# Patient Record
Sex: Female | Born: 1960
Health system: Southern US, Community
[De-identification: ages and names within clinical notes are randomized; demographics above are authoritative.]

## PROBLEM LIST (undated history)

## (undated) DIAGNOSIS — Z9289 Personal history of other medical treatment: Secondary | ICD-10-CM

## (undated) DIAGNOSIS — T7840XA Allergy, unspecified, initial encounter: Secondary | ICD-10-CM

## (undated) DIAGNOSIS — F419 Anxiety disorder, unspecified: Secondary | ICD-10-CM

## (undated) DIAGNOSIS — D649 Anemia, unspecified: Secondary | ICD-10-CM

## (undated) HISTORY — DX: Anxiety disorder, unspecified: F41.9

## (undated) HISTORY — DX: Allergy, unspecified, initial encounter: T78.40XA

## (undated) HISTORY — DX: Anemia, unspecified: D64.9

## (undated) HISTORY — DX: Personal history of other medical treatment: Z92.89

## (undated) HISTORY — PX: TUBAL LIGATION: SHX77

---

## 2005-12-18 HISTORY — PX: MOUTH SURGERY: SHX715

## 2015-03-09 DIAGNOSIS — Z9289 Personal history of other medical treatment: Secondary | ICD-10-CM

## 2015-03-09 HISTORY — DX: Personal history of other medical treatment: Z92.89

## 2015-03-09 LAB — HM PAP SMEAR: HM Pap smear: NEGATIVE

## 2015-03-16 LAB — HM MAMMOGRAPHY

## 2016-10-18 HISTORY — PX: ROTATOR CUFF REPAIR: SHX139

## 2017-11-22 ENCOUNTER — Ambulatory Visit (INDEPENDENT_AMBULATORY_CARE_PROVIDER_SITE_OTHER): Payer: BLUE CROSS/BLUE SHIELD | Admitting: Maternal Newborn

## 2017-11-22 ENCOUNTER — Encounter: Payer: Self-pay | Admitting: Maternal Newborn

## 2017-11-22 VITALS — BP 120/80 | HR 84 | Ht 63.0 in | Wt 144.0 lb

## 2017-11-22 DIAGNOSIS — Z131 Encounter for screening for diabetes mellitus: Secondary | ICD-10-CM

## 2017-11-22 DIAGNOSIS — Z1329 Encounter for screening for other suspected endocrine disorder: Secondary | ICD-10-CM

## 2017-11-22 DIAGNOSIS — Z13220 Encounter for screening for lipoid disorders: Secondary | ICD-10-CM | POA: Diagnosis not present

## 2017-11-22 DIAGNOSIS — Z01419 Encounter for gynecological examination (general) (routine) without abnormal findings: Secondary | ICD-10-CM

## 2017-11-22 DIAGNOSIS — Z1321 Encounter for screening for nutritional disorder: Secondary | ICD-10-CM | POA: Diagnosis not present

## 2017-11-22 DIAGNOSIS — G4709 Other insomnia: Secondary | ICD-10-CM

## 2017-11-22 DIAGNOSIS — Z124 Encounter for screening for malignant neoplasm of cervix: Secondary | ICD-10-CM | POA: Diagnosis not present

## 2017-11-22 MED ORDER — ALPRAZOLAM 0.25 MG PO TABS: 0.2500 mg | ORAL_TABLET | Freq: Every evening | ORAL | 2 refills | Status: DC | PRN

## 2017-11-22 NOTE — Progress Notes (Signed)
Gynecology Annual Exam  PCP: Patient, No Pcp Per  Chief Complaint:  Chief Complaint  Patient presents with  . Gynecologic Exam    clogged hair, not tender; ref for mammo; ref for gastro; old rx to discuss    History of Present Illness:Patient is a 56 y.o. [id number] presents for annual exam. The patient has no complaints today.   LMP: No LMP recorded. Patient has had an ablation. Postcoital Bleeding: no Dysmenorrhea: not applicable   The patient is sexually active. She denies dyspareunia.  The patient does perform self breast exams.  There is no notable family history of breast or ovarian cancer in her family.  The patient wears seatbelts: yes.   The patient has regular exercise: yes.    The patient denies current symptoms of depression.     Review of Systems  Constitutional: Negative for fever, malaise/fatigue and weight loss.  HENT: Negative for congestion, sinus pain and sore throat.   Eyes: Negative.   Respiratory: Negative for cough, shortness of breath and wheezing.   Cardiovascular: Negative for chest pain and palpitations.  Gastrointestinal: Negative for abdominal pain, constipation, diarrhea, heartburn and nausea.  Genitourinary: Negative for dysuria, frequency and urgency.  Musculoskeletal:       Shoulder pain from rotator cuff injury  Skin: Negative.   Endo/Heme/Allergies: Negative.   Psychiatric/Behavioral: Negative for depression. The patient is nervous/anxious.   All other systems reviewed and are negative.   Past Medical History:  Past Medical History:  Diagnosis Date  . History of mammogram 05/2013; 03/16/15   neg; neg  . History of Papanicolaou smear of cervix 03/09/2015   neg    Past Surgical History:  Past Surgical History:  Procedure Laterality Date  . MOUTH SURGERY  2007  . ROTATOR CUFF REPAIR Right 10/2016  . TUBAL LIGATION      Gynecologic History:  No LMP recorded. Patient has had an ablation. Last Pap: 03/09/2015. Results were:  NIL. Last mammogram: 03/16/2015 Results were: BI-RAD I Obstetric History: [id number]: G2P2002  Family History:  Family History  Problem Relation Age of Onset  . Hypertension Mother   . Cancer Father        lung    Social History:  Social History   Socioeconomic History  . Marital status: Married    Spouse name: Not on file  . Number of children: Not on file  . Years of education: Not on file  . Highest education level: Not on file  Social Needs  . Financial resource strain: Not on file  . Food insecurity - worry: Not on file  . Food insecurity - inability: Not on file  . Transportation needs - medical: Not on file  . Transportation needs - non-medical: Not on file  Occupational History  . Not on file  Tobacco Use  . Smoking status: Never Smoker  . Smokeless tobacco: Never Used  Substance and Sexual Activity  . Alcohol use: Yes  . Drug use: No  . Sexual activity: Yes    Birth control/protection: Surgical  Other Topics Concern  . Not on file  Social History Narrative  . Not on file    Allergies:  No Known Allergies  Medications: Prior to Admission medications   Not on File    Physical Exam Blood pressure 120/80, pulse 84, height 5\' 3"  (1.6 m), weight 144 lb (65.3 kg).  General: NAD HEENT: normocephalic, anicteric Thyroid: no enlargement, no palpable nodules Pulmonary: No increased work of breathing, CTAB Cardiovascular: RRR, distal pulses  2+ Breast: Breasts symmetrical, no tenderness, no palpable nodules or masses, no skin or nipple retraction present, no nipple discharge.  No axillary or supraclavicular lymphadenopathy. Abdomen: NABS, soft, non-tender, non-distended.  Umbilicus without lesions.  No hepatomegaly, splenomegaly or masses palpable. No evidence of hernia  Genitourinary:  External: Small raised non-tender bump that appears to be a blocked hair  follicle. Otherwise, normal external female genitalia.  Normal urethral meatus,  normal Bartholin's and Skene's  glands.    Vagina: Normal vaginal mucosa, no evidence of prolapse.    Cervix: Grossly normal in appearance, no bleeding  Uterus: Non-enlarged, mobile, normal contour.  No CMT  Adnexa: ovaries non-enlarged, no adnexal masses  Rectal: deferred  Lymphatic: no evidence of inguinal lymphadenopathy Extremities: no edema, erythema, or tenderness Neurologic: Grossly intact Psychiatric: mood appropriate, affect full   Assessment: 56 y.o. G2P2002 routine annual exam.  Plan: Problem List Items Addressed This Visit    None    Visit Diagnoses    Encounter for annual routine gynecological examination    -  Primary   Relevant Orders   MM DIGITAL SCREENING BILATERAL   Ambulatory referral to Gastroenterology   Other insomnia       Relevant Medications   ALPRAZolam (XANAX) 0.25 MG tablet   Pap smear for cervical cancer screening       Relevant Orders   Pap IG and HPV (high risk) DNA detection      1) Mammogram - recommend yearly screening mammogram.  Mammogram was ordered today.  2) STI screening was offered and declined.  3) ASCCP guidelines and rationale discussed.  Patient opts for every 3 year screening interval.  4) Osteoporosis  - per USPTF routine screening DEXA at age 56  5) Routine healthcare maintenance including cholesterol, diabetes screening discussed: to return fasting at a later date.  6) Colonoscopy was done 5-6 years ago. Some polyps were removed. She requested a referral to Dr. Mechele CollinElliott (gastroenterology) for a repeat colonoscopy.    7) She has a prescription for 0.5 mg of alazopram from another provider that she takes about once a month for anxiety related insomnia. She said that she usually takes half a tablet with good results. Ordered 0.25 mg alazopram today for occasional use to relieve insomnia.  8) Follow up 1 year for routine annual.  Marcelyn BruinsJacelyn Schmid, CNM 11/23/2017  5:28 AM

## 2017-11-24 LAB — PAP IG AND HPV HIGH-RISK
HPV, HIGH-RISK: NEGATIVE
PAP SMEAR COMMENT: 0

## 2017-11-30 ENCOUNTER — Other Ambulatory Visit: Payer: Self-pay | Admitting: Maternal Newborn

## 2017-11-30 DIAGNOSIS — Z1211 Encounter for screening for malignant neoplasm of colon: Secondary | ICD-10-CM

## 2017-12-27 ENCOUNTER — Ambulatory Visit
Admission: RE | Admit: 2017-12-27 | Discharge: 2017-12-27 | Disposition: A | Discharge status: Home / Self Care | Payer: BLUE CROSS/BLUE SHIELD | Source: Ambulatory Visit | Attending: Maternal Newborn | Admitting: Maternal Newborn

## 2017-12-27 DIAGNOSIS — Z1231 Encounter for screening mammogram for malignant neoplasm of breast: Secondary | ICD-10-CM | POA: Insufficient documentation

## 2017-12-27 DIAGNOSIS — Z01419 Encounter for gynecological examination (general) (routine) without abnormal findings: Secondary | ICD-10-CM

## 2018-01-02 ENCOUNTER — Other Ambulatory Visit: Payer: Self-pay | Admitting: *Deleted

## 2018-01-02 ENCOUNTER — Inpatient Hospital Stay
Admission: RE | Admit: 2018-01-02 | Discharge: 2018-01-02 | Disposition: A | Discharge status: Home / Self Care | Payer: Self-pay | Source: Ambulatory Visit | Attending: *Deleted | Admitting: *Deleted

## 2018-01-02 DIAGNOSIS — Z9289 Personal history of other medical treatment: Secondary | ICD-10-CM

## 2018-02-21 ENCOUNTER — Encounter: Payer: Self-pay | Admitting: Physician Assistant

## 2018-02-21 LAB — HM COLONOSCOPY

## 2018-09-19 ENCOUNTER — Other Ambulatory Visit
Admission: RE | Admit: 2018-09-19 | Discharge: 2018-09-19 | Disposition: A | Discharge status: Home / Self Care | Payer: BLUE CROSS/BLUE SHIELD | Source: Ambulatory Visit | Attending: Nurse Practitioner | Admitting: Nurse Practitioner

## 2018-09-19 DIAGNOSIS — R197 Diarrhea, unspecified: Secondary | ICD-10-CM | POA: Insufficient documentation

## 2018-09-19 LAB — C DIFFICILE QUICK SCREEN W PCR REFLEX
C Diff antigen: NEGATIVE
C Diff interpretation: NOT DETECTED
C Diff toxin: NEGATIVE

## 2018-09-20 LAB — GASTROINTESTINAL PANEL BY PCR, STOOL (REPLACES STOOL CULTURE)

## 2018-11-25 ENCOUNTER — Ambulatory Visit (INDEPENDENT_AMBULATORY_CARE_PROVIDER_SITE_OTHER): Payer: BLUE CROSS/BLUE SHIELD | Admitting: Maternal Newborn

## 2018-11-25 ENCOUNTER — Encounter: Payer: Self-pay | Admitting: Maternal Newborn

## 2018-11-25 VITALS — BP 120/80 | Ht 63.0 in | Wt 152.0 lb

## 2018-11-25 DIAGNOSIS — Z01419 Encounter for gynecological examination (general) (routine) without abnormal findings: Secondary | ICD-10-CM | POA: Diagnosis not present

## 2018-11-25 DIAGNOSIS — Z1231 Encounter for screening mammogram for malignant neoplasm of breast: Secondary | ICD-10-CM

## 2018-11-25 DIAGNOSIS — G4709 Other insomnia: Secondary | ICD-10-CM

## 2018-11-25 NOTE — Progress Notes (Signed)
Gynecology Annual Exam  PCP: Patient, No Pcp Per  Chief Complaint:  Chief Complaint  Patient presents with  . Gynecologic Exam    History of Present Illness:Patient is a 57 y.o. G2P2002 presents for annual exam. The patient has no complaints today.   LMP: No LMP recorded. Patient has had an ablation.   The patient is sexually active. She denies dyspareunia.  The patient does perform self breast exams. There is a history of breast cancer in her cousin on her father's side at age 59. There is no other notable family history of breast or ovarian cancer in her family.  The patient wears seatbelts: yes.   The patient has regular exercise: yes.    The patient denies current symptoms of depression.     Review of Systems  Constitutional: Negative.   HENT: Negative.   Eyes: Negative.   Respiratory: Negative for cough, shortness of breath and wheezing.   Cardiovascular: Negative for chest pain and palpitations.  Gastrointestinal: Negative for abdominal pain, diarrhea and nausea.  Genitourinary: Negative.   Musculoskeletal: Positive for back pain, joint pain and myalgias.  Skin: Negative.   Neurological: Negative.   Endo/Heme/Allergies: Negative.   Psychiatric/Behavioral: Negative for depression. The patient is nervous/anxious.   All other systems reviewed and are negative.   Past Medical History:  Past Medical History:  Diagnosis Date  . History of mammogram 05/2013; 03/16/15   neg; neg  . History of Papanicolaou smear of cervix 03/09/2015   neg    Past Surgical History:  Past Surgical History:  Procedure Laterality Date  . MOUTH SURGERY  2007  . ROTATOR CUFF REPAIR Right 10/2016  . TUBAL LIGATION      Gynecologic History:  No LMP recorded. Patient has had an ablation. Last Pap: Results were: 11/22/2017 NIL and HR HPV negative  Last mammogram: 12/27/2017 Results were: BI-RAD I Obstetric History: [id number]: G2P2002  Family History:  Family History  Problem Relation Age of  Onset  . Hypertension Mother   . Cancer Father        lung  . Breast cancer Neg Hx     Social History:  Social History   Socioeconomic History  . Marital status: Married    Spouse name: Not on file  . Number of children: Not on file  . Years of education: Not on file  . Highest education level: Not on file  Occupational History  . Not on file  Social Needs  . Financial resource strain: Not on file  . Food insecurity:    Worry: Not on file    Inability: Not on file  . Transportation needs:    Medical: Not on file    Non-medical: Not on file  Tobacco Use  . Smoking status: Never Smoker  . Smokeless tobacco: Never Used  Substance and Sexual Activity  . Alcohol use: Yes  . Drug use: No  . Sexual activity: Yes    Birth control/protection: Surgical  Lifestyle  . Physical activity:    Days per week: Not on file    Minutes per session: Not on file  . Stress: Not on file  Relationships  . Social connections:    Talks on phone: Not on file    Gets together: Not on file    Attends religious service: Not on file    Active member of club or organization: Not on file    Attends meetings of clubs or organizations: Not on file    Relationship status: Not  on file  . Intimate partner violence:    Fear of current or ex partner: Not on file    Emotionally abused: Not on file    Physically abused: Not on file    Forced sexual activity: Not on file  Other Topics Concern  . Not on file  Social History Narrative  . Not on file    Allergies:  No Known Allergies  Medications: Prior to Admission medications   Medication Sig Start Date End Date Taking? Authorizing Provider  acetaminophen (TYLENOL) 325 MG tablet Take 2 tablets by mouth as needed.   Yes [provider]  ALPRAZolam (XANAX) 0.25 MG tablet Take 1 tablet (0.25 mg total) by mouth at bedtime as needed for anxiety. 11/22/17  Yes Oswaldo Conroy, CNM  tiZANidine (ZANAFLEX) 4 MG tablet tizanidine 4 mg tablet    Yes [provider]  fluticasone (FLONASE) 50 MCG/ACT nasal spray Place 1 spray into both nostrils as needed.    [provider]  Loratadine 10 MG CAPS Take 1 tablet by mouth as needed.    [provider]  mometasone (ELOCON) 0.1 % cream Apply 1 application topically daily.    [provider]    Physical Exam Vitals: Blood pressure 120/80, height 5\' 3"  (1.6 m), weight 152 lb (68.9 kg).  General: NAD HEENT: normocephalic, anicteric Thyroid: no enlargement, no palpable nodules Pulmonary: No increased work of breathing, CTAB Cardiovascular: RRR, distal pulses 2+ Breast: Breast symmetrical, no tenderness, no palpable nodules or masses, no skin or nipple retraction present, no nipple discharge.  No axillary or supraclavicular lymphadenopathy. Abdomen: NABS, soft, non-tender, non-distended.  Umbilicus without lesions.  No hepatomegaly, splenomegaly or masses palpable. No evidence of hernia  Genitourinary:  External: Normal external female genitalia.  Normal urethral meatus, normal  Bartholin's and Skene's glands.    Vagina: Normal vaginal mucosa, no evidence of prolapse.    Cervix: Grossly normal in appearance, no bleeding  Uterus: Non-enlarged, mobile, normal contour.  No CMT  Adnexa: ovaries non-enlarged, no adnexal masses  Rectal: deferred  Lymphatic: no evidence of inguinal lymphadenopathy Extremities: no edema, erythema, or tenderness Neurologic: Grossly intact Psychiatric: mood appropriate, affect full   Assessment: 57 y.o. G2P2002 routine annual exam  Plan: Problem List Items Addressed This Visit    None    Visit Diagnoses    Women's annual routine gynecological examination    -  Primary   Breast cancer screening by mammogram       Relevant Orders   MM DIGITAL SCREENING BILATERAL      1) Mammogram - recommend yearly screening mammogram.  Mammogram Was ordered today  2) STI screening was offered and declined  3) ASCCP guidelines and  rational discussed.  Patient opts for every 3 years screening interval  4) Osteoporosis  - per USPTF routine screening DEXA at age 57   5) Routine healthcare maintenance including cholesterol, diabetes screening discussed managed by PCP  6) Colonoscopy .  Screening recommended starting at age 83 for average risk individuals, age 47 for individuals deemed at increased risk (including African Americans) and recommended to continue until age 26.  For patient age 45-85 individualized approach is recommended.  Gold standard screening is via colonoscopy, Cologuard screening is an acceptable alternative for patient unwilling or unable to undergo colonoscopy.  "Colorectal cancer screening for average?risk adults: 2018 guideline update from the American Cancer Society"[place]: A Cancer Journal for Clinicians: May 16, 2017   7) Follow up 1 year for routine annual

## 2018-11-28 ENCOUNTER — Encounter: Payer: Self-pay | Admitting: Maternal Newborn

## 2018-11-29 MED ORDER — ALPRAZOLAM 0.25 MG PO TABS: 0.2500 mg | ORAL_TABLET | Freq: Every evening | ORAL | 2 refills | Status: DC | PRN

## 2018-11-29 NOTE — Addendum Note (Signed)
Addended by: Marcelyn BruinsSCHMID, Kailyn Vanderslice on: 11/29/2018 10:14 PM   Modules accepted: Orders

## 2019-01-15 ENCOUNTER — Other Ambulatory Visit: Payer: Self-pay | Admitting: Maternal Newborn

## 2019-01-15 DIAGNOSIS — Z1231 Encounter for screening mammogram for malignant neoplasm of breast: Secondary | ICD-10-CM

## 2019-01-28 ENCOUNTER — Ambulatory Visit
Admission: RE | Admit: 2019-01-28 | Discharge: 2019-01-28 | Disposition: A | Discharge status: Home / Self Care | Payer: BLUE CROSS/BLUE SHIELD | Source: Ambulatory Visit | Attending: Maternal Newborn | Admitting: Maternal Newborn

## 2019-01-28 DIAGNOSIS — Z1231 Encounter for screening mammogram for malignant neoplasm of breast: Secondary | ICD-10-CM | POA: Diagnosis present

## 2019-04-09 ENCOUNTER — Other Ambulatory Visit: Payer: Self-pay | Admitting: Maternal Newborn

## 2019-04-09 DIAGNOSIS — G4709 Other insomnia: Secondary | ICD-10-CM

## 2019-05-27 ENCOUNTER — Other Ambulatory Visit: Payer: Self-pay | Admitting: Maternal Newborn

## 2019-05-27 DIAGNOSIS — G4709 Other insomnia: Secondary | ICD-10-CM

## 2019-08-04 ENCOUNTER — Other Ambulatory Visit: Payer: Self-pay | Admitting: Maternal Newborn

## 2019-08-04 DIAGNOSIS — G4709 Other insomnia: Secondary | ICD-10-CM

## 2019-08-15 DIAGNOSIS — R69 Illness, unspecified: Secondary | ICD-10-CM | POA: Diagnosis not present

## 2019-09-05 DIAGNOSIS — R69 Illness, unspecified: Secondary | ICD-10-CM | POA: Diagnosis not present

## 2019-09-08 ENCOUNTER — Other Ambulatory Visit: Payer: Self-pay | Admitting: Maternal Newborn

## 2019-09-08 DIAGNOSIS — G4709 Other insomnia: Secondary | ICD-10-CM

## 2019-09-08 NOTE — Telephone Encounter (Signed)
Pt inquiring about refill request for alprazolam that was sent by pharmacy on Friday and again today. Pharmacy/patient waiting on repsonse. [phone].

## 2019-09-08 NOTE — Telephone Encounter (Signed)
Sorry, this never came to me on Friday. Will approve now.

## 2019-09-19 ENCOUNTER — Ambulatory Visit: Payer: Self-pay

## 2019-09-19 DIAGNOSIS — Z23 Encounter for immunization: Secondary | ICD-10-CM
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
## 2019-10-08 ENCOUNTER — Other Ambulatory Visit: Payer: Self-pay

## 2019-10-08 ENCOUNTER — Ambulatory Visit (INDEPENDENT_AMBULATORY_CARE_PROVIDER_SITE_OTHER): Payer: 59 | Admitting: Physician Assistant

## 2019-10-08 ENCOUNTER — Encounter: Payer: Self-pay | Admitting: Physician Assistant

## 2019-10-08 VITALS — BP 128/74 | HR 76 | Temp 97.3°F | Resp 16 | Ht 63.0 in | Wt 148.8 lb

## 2019-10-08 DIAGNOSIS — Z1231 Encounter for screening mammogram for malignant neoplasm of breast: Secondary | ICD-10-CM | POA: Diagnosis not present

## 2019-10-08 DIAGNOSIS — G4700 Insomnia, unspecified: Secondary | ICD-10-CM | POA: Diagnosis not present

## 2019-10-08 MED ORDER — TRAZODONE HCL 50 MG PO TABS: ORAL_TABLET | ORAL | 0 refills | Status: AC

## 2019-10-08 NOTE — Patient Instructions (Signed)

## 2019-10-23 DIAGNOSIS — L578 Other skin changes due to chronic exposure to nonionizing radiation: Secondary | ICD-10-CM | POA: Diagnosis not present

## 2019-10-23 DIAGNOSIS — D485 Neoplasm of uncertain behavior of skin: Secondary | ICD-10-CM | POA: Diagnosis not present

## 2019-10-23 DIAGNOSIS — Z872 Personal history of diseases of the skin and subcutaneous tissue: Secondary | ICD-10-CM | POA: Diagnosis not present

## 2019-10-23 DIAGNOSIS — Z85828 Personal history of other malignant neoplasm of skin: Secondary | ICD-10-CM | POA: Diagnosis not present

## 2019-10-23 DIAGNOSIS — Z86018 Personal history of other benign neoplasm: Secondary | ICD-10-CM | POA: Diagnosis not present

## 2019-10-23 DIAGNOSIS — L570 Actinic keratosis: Secondary | ICD-10-CM | POA: Diagnosis not present

## 2019-12-01 DIAGNOSIS — L570 Actinic keratosis: Secondary | ICD-10-CM | POA: Diagnosis not present

## 2019-12-23 ENCOUNTER — Other Ambulatory Visit: Payer: Self-pay | Admitting: Physician Assistant

## 2019-12-23 DIAGNOSIS — Z1231 Encounter for screening mammogram for malignant neoplasm of breast: Secondary | ICD-10-CM

## 2020-01-22 DIAGNOSIS — H5213 Myopia, bilateral: Secondary | ICD-10-CM | POA: Diagnosis not present

## 2020-02-03 ENCOUNTER — Ambulatory Visit
Admission: RE | Admit: 2020-02-03 | Discharge: 2020-02-03 | Disposition: A | Discharge status: Home / Self Care | Payer: Managed Care, Other (non HMO) | Source: Ambulatory Visit | Attending: Physician Assistant | Admitting: Physician Assistant

## 2020-02-03 DIAGNOSIS — Z1231 Encounter for screening mammogram for malignant neoplasm of breast: Secondary | ICD-10-CM | POA: Diagnosis not present

## 2020-02-25 DIAGNOSIS — D3610 Benign neoplasm of peripheral nerves and autonomic nervous system, unspecified: Secondary | ICD-10-CM | POA: Diagnosis not present

## 2020-02-25 DIAGNOSIS — D481 Neoplasm of uncertain behavior of connective and other soft tissue: Secondary | ICD-10-CM | POA: Diagnosis not present

## 2020-03-11 DIAGNOSIS — R002 Palpitations: Secondary | ICD-10-CM | POA: Diagnosis not present

## 2020-03-11 DIAGNOSIS — R079 Chest pain, unspecified: Secondary | ICD-10-CM | POA: Diagnosis not present

## 2020-03-12 DIAGNOSIS — R079 Chest pain, unspecified: Secondary | ICD-10-CM | POA: Diagnosis not present

## 2020-03-17 DIAGNOSIS — R69 Illness, unspecified: Secondary | ICD-10-CM | POA: Diagnosis not present

## 2020-03-17 DIAGNOSIS — I10 Essential (primary) hypertension: Secondary | ICD-10-CM | POA: Diagnosis not present

## 2020-03-17 DIAGNOSIS — Z1283 Encounter for screening for malignant neoplasm of skin: Secondary | ICD-10-CM | POA: Diagnosis not present

## 2020-03-18 DIAGNOSIS — I10 Essential (primary) hypertension: Secondary | ICD-10-CM | POA: Diagnosis not present

## 2020-04-16 DIAGNOSIS — R69 Illness, unspecified: Secondary | ICD-10-CM | POA: Diagnosis not present

## 2020-06-02 DIAGNOSIS — R69 Illness, unspecified: Secondary | ICD-10-CM | POA: Diagnosis not present

## 2021-12-27 IMAGING — MG DIGITAL SCREENING BILAT W/ TOMO W/ CAD
8 series · 8 of 24 positions shown · non-contrast
Comparison: Previous exam(s).

CLINICAL DATA: Screening.

EXAM:
DIGITAL SCREENING BILATERAL MAMMOGRAM WITH TOMO AND CAD

[R MLO synth-2D]
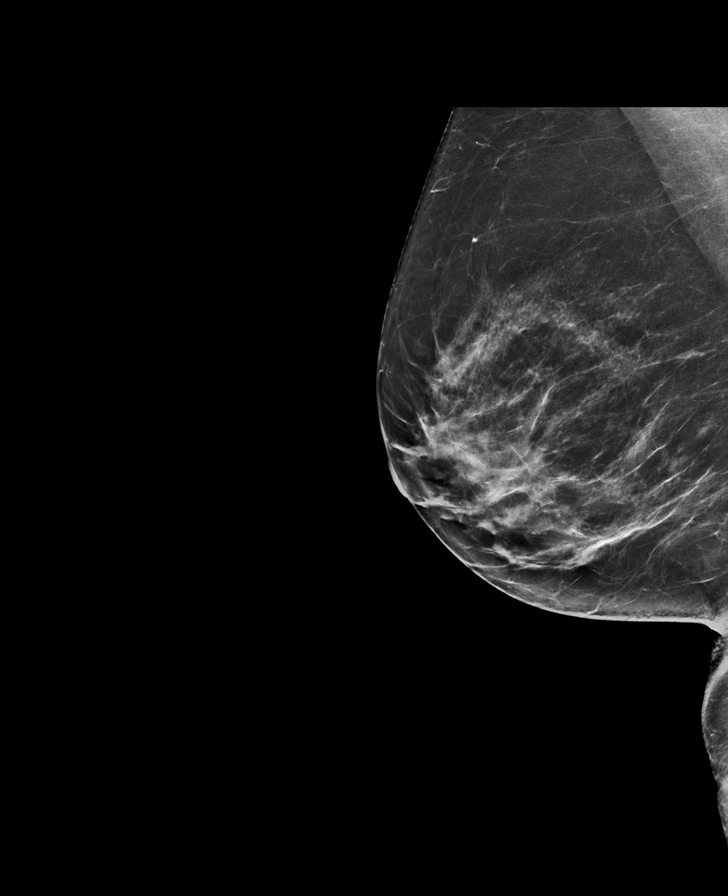

[L MLO synth-2D]
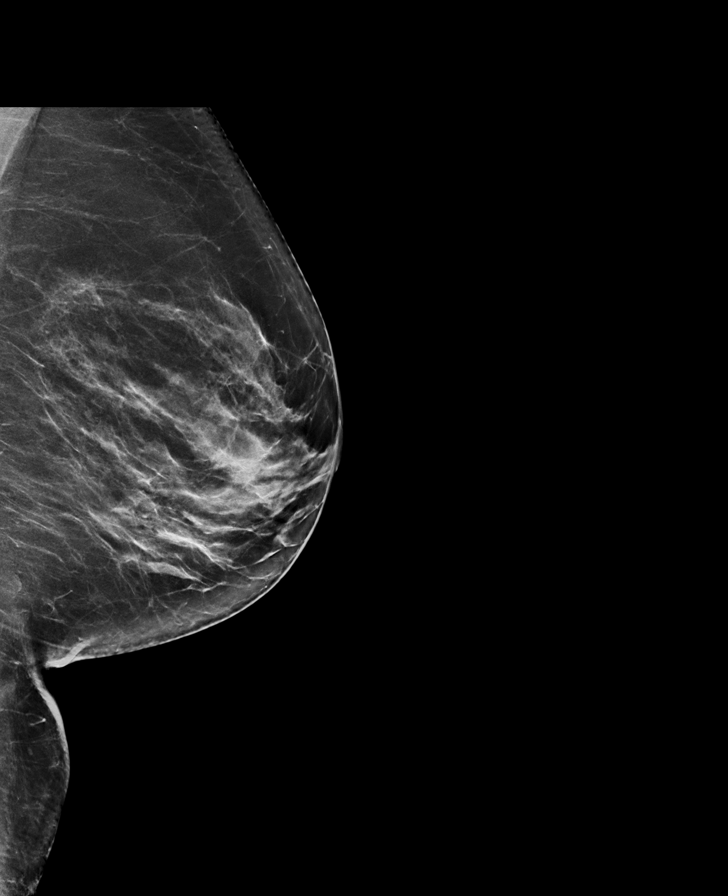

[L CC synth-2D]
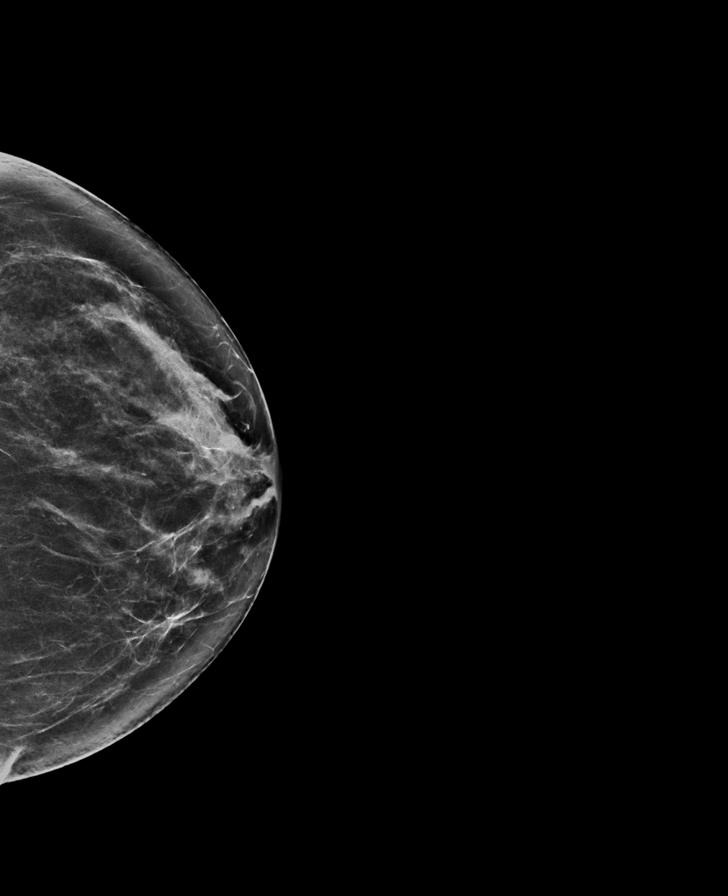

[R CC synth-2D]
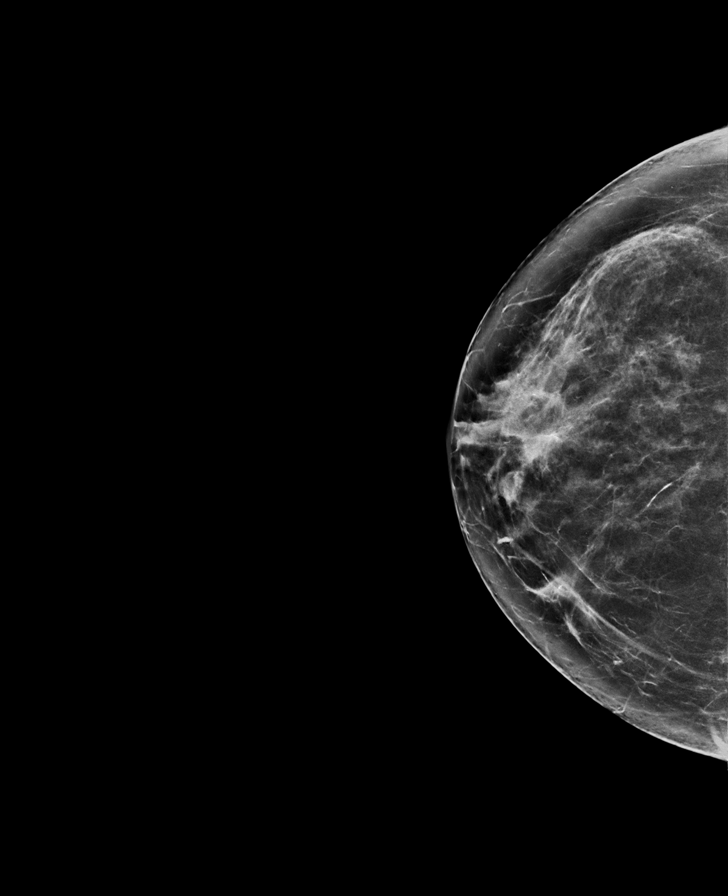

[R MLO tomo · tomo slice 39/76.0]
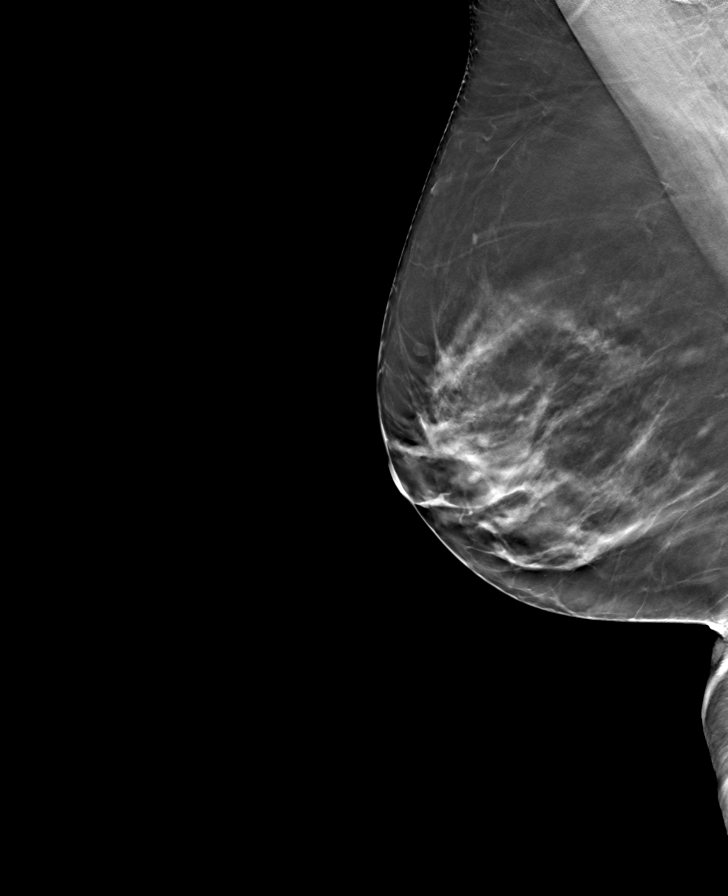

[R CC tomo · tomo slice 37/72.0]
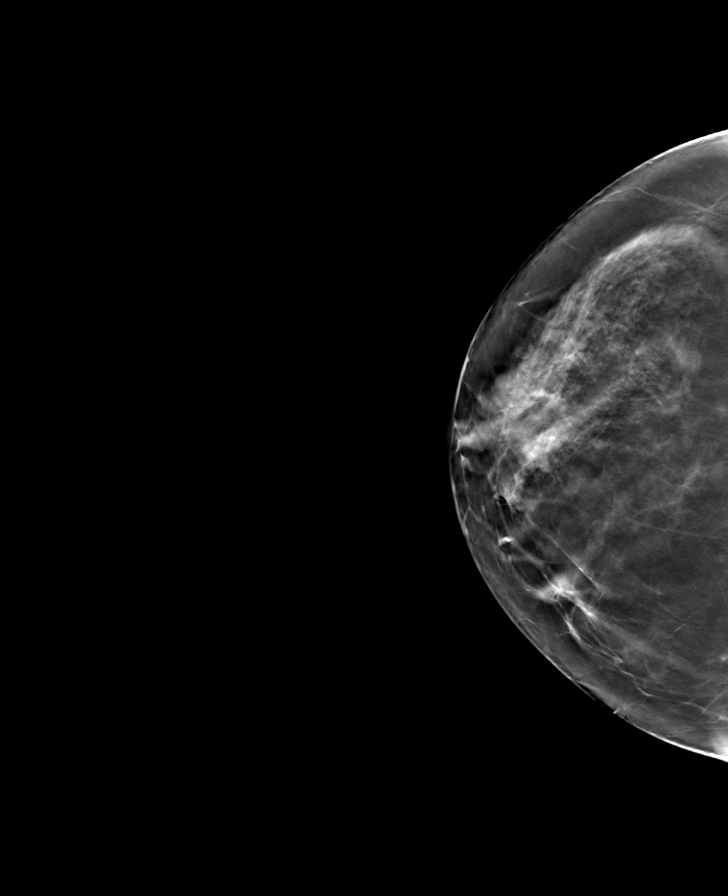

[L MLO tomo · tomo slice 39/76.0]
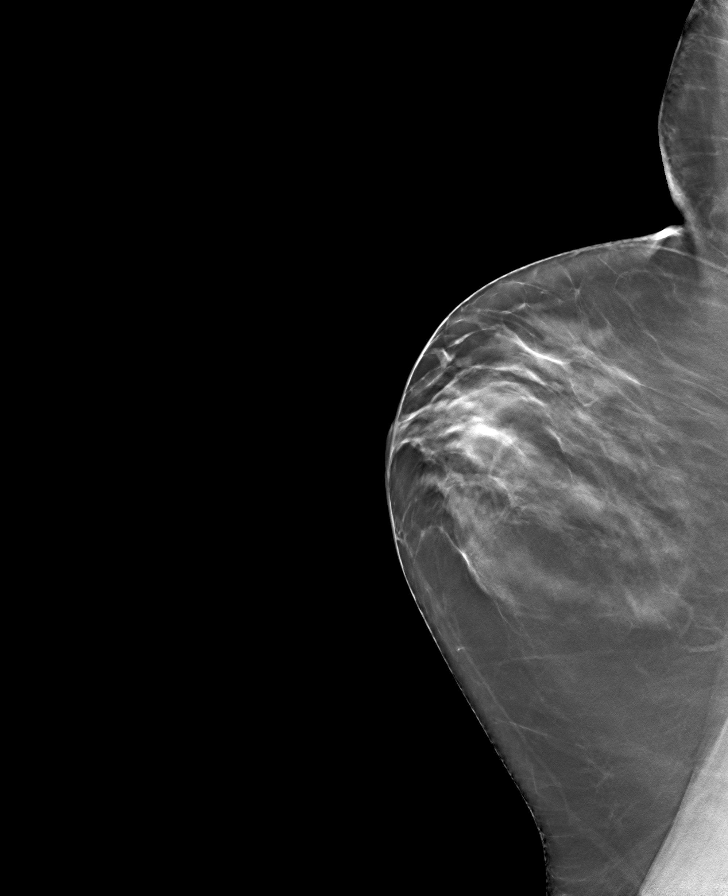

[L CC tomo · tomo slice 33/66.0]
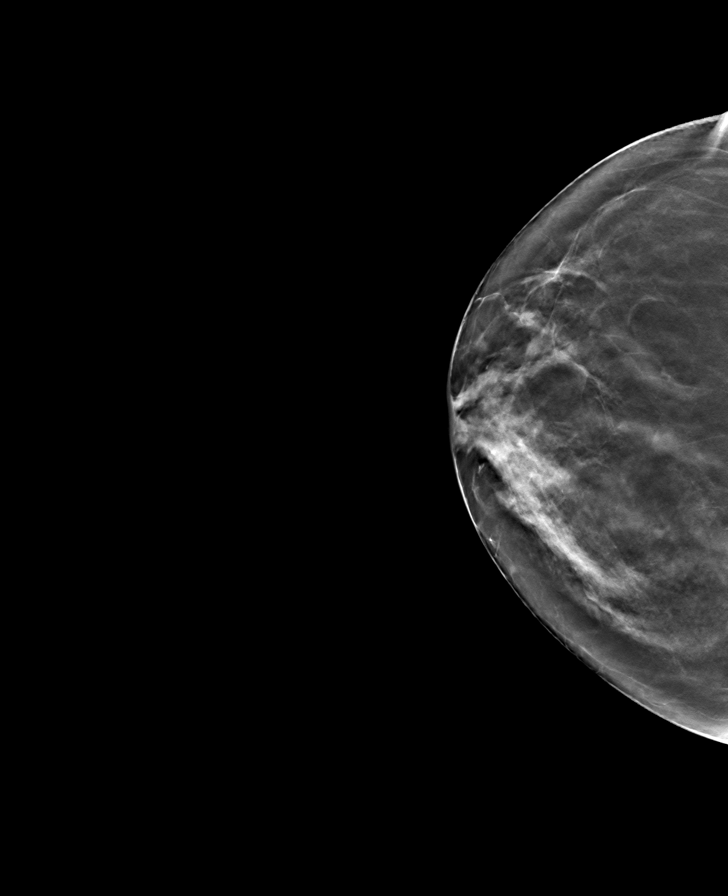

[8 of 24 positions shown; findings below may reference images not displayed]

ACR Breast Density Category c: The breast tissue is heterogeneously
dense, which may obscure small masses.
FINDINGS: There are no findings suspicious for malignancy. Images were
processed with CAD.
IMPRESSION: No mammographic evidence of malignancy. A result letter of this
screening mammogram will be mailed directly to the patient.

RECOMMENDATION:
Screening mammogram in one year. (Code:FT-U-LHB)

BI-RADS CATEGORY  1: Negative.
